# Patient Record
Sex: Female | Born: 1950 | Race: White | Hispanic: No
Health system: Southern US, Community
[De-identification: ages and names within clinical notes are randomized; demographics above are authoritative.]

---

## 2010-10-13 ENCOUNTER — Inpatient Hospital Stay: Payer: Self-pay | Admitting: Internal Medicine

## 2011-04-15 ENCOUNTER — Inpatient Hospital Stay: Payer: Self-pay

## 2011-04-23 ENCOUNTER — Emergency Department: Payer: Self-pay | Admitting: Emergency Medicine

## 2012-02-21 ENCOUNTER — Ambulatory Visit: Payer: Self-pay | Admitting: Family Medicine

## 2013-06-01 ENCOUNTER — Encounter: Payer: Self-pay | Admitting: Family Medicine

## 2013-06-04 ENCOUNTER — Encounter: Payer: Self-pay | Admitting: Family Medicine

## 2013-07-04 ENCOUNTER — Encounter: Payer: Self-pay | Admitting: Family Medicine

## 2013-08-04 ENCOUNTER — Encounter: Payer: Self-pay | Admitting: Family Medicine

## 2013-09-03 ENCOUNTER — Encounter: Payer: Self-pay | Admitting: Family Medicine

## 2013-09-18 ENCOUNTER — Ambulatory Visit: Payer: Self-pay | Admitting: Ophthalmology

## 2014-01-01 ENCOUNTER — Ambulatory Visit: Payer: Self-pay | Admitting: Ophthalmology

## 2014-07-24 ENCOUNTER — Ambulatory Visit: Payer: Self-pay | Admitting: Family Medicine

## 2015-01-24 NOTE — Op Note (Signed)
PATIENT NAME:  Dominique SamplesODD, Kimble MR#:  [id number] DATE OF BIRTH:  Jan 16, 1951  DATE OF PROCEDURE:  09/18/2013  PREOPERATIVE DIAGNOSIS: Visually significant cataract of the right eye.   POSTOPERATIVE DIAGNOSIS: Visually significant cataract of the right eye.   OPERATIVE PROCEDURE: Cataract extraction by phacoemulsification with implant of intraocular lens to right eye.   SURGEON: Galen ManilaWilliam Kinslie Hove, MD.   ANESTHESIA:  1. Managed anesthesia care.  2. Topical tetracaine drops followed by 2% Xylocaine jelly applied in the preoperative holding area.   COMPLICATIONS: None.   TECHNIQUE:  Stop-and-chop.  DESCRIPTION OF PROCEDURE: The patient was examined and consented in the preoperative holding area where the aforementioned topical anesthesia was applied to the right eye and then brought back to the Operating Room where the right  eye was prepped and draped in the usual sterile ophthalmic fashion and a lid speculum was placed. A paracentesis was created with the side port blade and the anterior chamber was filled with viscoelastic. A near clear corneal incision was performed with the steel keratome. A continuous curvilinear capsulorrhexis was performed with a cystotome followed by the capsulorrhexis forceps. Hydrodissection and hydrodelineation were carried out with BSS on a blunt cannula. The lens was removed in a stop-and-chop technique and the remaining cortical material was removed with the irrigation-aspiration handpiece. The capsular bag was inflated with viscoelastic and the Tecnis ZCB00, 23.5-diopter lens, serial number [id number] was placed in the capsular bag without complication. The remaining viscoelastic was removed from the eye with the irrigation-aspiration handpiece. The wounds were hydrated. The anterior chamber was flushed with Miostat and the eye was inflated to physiologic pressure. 0.1 mL of cefuroxime concentration 10 mg/mL was placed in the anterior chamber. The wounds were found to be  water tight. The eye was dressed with Vigamox. The patient was given protective glasses to wear throughout the day and a shield with which to sleep tonight. The patient was also given drops with which to begin a drop regimen today and will follow-up with me in one day.   ____________________________ Jerilee FieldWilliam L. Taelor Waymire, MD wlp:cs D: 09/18/2013 15:26:09 ET T: 09/18/2013 15:53:43 ET JOB#: [id number]  cc: Kyren Knick L. Bruno Leach, MD, <Dictator> Jerilee FieldWILLIAM L Aylee Littrell MD ELECTRONICALLY SIGNED 09/19/2013 9:50

## 2015-01-25 NOTE — Op Note (Signed)
PATIENT NAME:  Dominique SamplesODD, Angelene MR#:  [id number] DATE OF BIRTH:  Mar 31, 1951  DATE OF PROCEDURE:  01/01/2014  PREOPERATIVE DIAGNOSIS: Visually significant cataract of the left eye.   POSTOPERATIVE DIAGNOSIS: Visually significant cataract of the left eye.   OPERATIVE PROCEDURE: Cataract extraction by phacoemulsification with implant of intraocular lens to left eye.   SURGEON: Galen ManilaWilliam Paton Crum, MD.   ANESTHESIA:  1. Managed anesthesia care.  2. Topical tetracaine drops followed by 2% Xylocaine jelly applied in the preoperative holding area.   COMPLICATIONS: None.   TECHNIQUE: Stop and chop.   DESCRIPTION OF PROCEDURE: The patient was examined and consented in the preoperative holding area where the aforementioned topical anesthesia was applied to the left eye and then brought back to the Operating Room where the left eye was prepped and draped in the usual sterile ophthalmic fashion and a lid speculum was placed. A paracentesis was created with the side port blade and the anterior chamber was filled with viscoelastic. A near clear corneal incision was performed with the steel keratome. A continuous curvilinear capsulorrhexis was performed with a cystotome followed by the capsulorrhexis forceps. Hydrodissection and hydrodelineation were carried out with BSS on a blunt cannula. The lens was removed in a stop and chop technique and the remaining cortical material was removed with the irrigation-aspiration handpiece. The capsular bag was inflated with viscoelastic and the Tecnis ZCB00 23.5-diopter lens, serial number [id number] was placed in the capsular bag without complication. The remaining viscoelastic was removed from the eye with the irrigation-aspiration handpiece. The wounds were hydrated. The anterior chamber was flushed with Miostat and the eye was inflated to physiologic pressure. 0.1 mL of cefuroxime concentration 10 mg/mL was placed in the anterior chamber. The wounds were found to be water  tight. The eye was dressed with Vigamox. The patient was given protective glasses to wear throughout the day and a shield with which to sleep tonight. The patient was also given drops with which to begin a drop regimen today and will follow up with me in 1 day.    ____________________________ Jerilee FieldWilliam L. Jatin Naumann, MD wlp:lt D: 01/01/2014 21:25:54 ET T: 01/01/2014 23:28:50 ET JOB#: [id number]  cc: Victoriana Aziz L. Ralene Gasparyan, MD, <Dictator> Jerilee FieldWILLIAM L Rosiland Sen MD ELECTRONICALLY SIGNED 01/02/2014 8:58

## 2018-05-28 ENCOUNTER — Emergency Department
Admission: EM | Admit: 2018-05-28 | Discharge: 2018-05-28 | Disposition: A | Discharge status: Home / Self Care | Payer: Medicare Other | Attending: Student in an Organized Health Care Education/Training Program | Admitting: Student in an Organized Health Care Education/Training Program

## 2018-05-28 ENCOUNTER — Emergency Department: Payer: Medicare Other

## 2018-05-28 ENCOUNTER — Other Ambulatory Visit: Payer: Self-pay

## 2018-05-28 DIAGNOSIS — S0990XA Unspecified injury of head, initial encounter: Secondary | ICD-10-CM

## 2018-05-28 DIAGNOSIS — Y998 Other external cause status: Secondary | ICD-10-CM | POA: Diagnosis not present

## 2018-05-28 DIAGNOSIS — Y929 Unspecified place or not applicable: Secondary | ICD-10-CM | POA: Insufficient documentation

## 2018-05-28 DIAGNOSIS — W19XXXA Unspecified fall, initial encounter: Secondary | ICD-10-CM

## 2018-05-28 DIAGNOSIS — W07XXXA Fall from chair, initial encounter: Secondary | ICD-10-CM | POA: Diagnosis not present

## 2018-05-28 DIAGNOSIS — Y939 Activity, unspecified: Secondary | ICD-10-CM | POA: Insufficient documentation

## 2018-05-28 LAB — BASIC METABOLIC PANEL
Anion gap: 7 (ref 5–15)
BUN: 20 mg/dL (ref 8–23)
CHLORIDE: 99 mmol/L (ref 98–111)
CO2: 29 mmol/L (ref 22–32)
CREATININE: 0.73 mg/dL (ref 0.44–1.00)
Calcium: 9.8 mg/dL (ref 8.9–10.3)
GFR calc Af Amer: >60 mL/min (ref >60)
GFR calc non Af Amer: >60 mL/min (ref >60)
Glucose, Bld: 133 mg/dL — ABNORMAL HIGH (ref 70–99)
Potassium: 4.6 mmol/L (ref 3.5–5.1)
Sodium: 135 mmol/L (ref 135–145)

## 2018-05-28 LAB — CBC WITH DIFFERENTIAL/PLATELET
Basophils Absolute: 0.1 10*3/uL (ref 0–0.1)
Basophils Relative: 1 %
Eosinophils Absolute: 0.1 10*3/uL (ref 0–0.7)
Eosinophils Relative: 2 %
HEMATOCRIT: 39.2 % (ref 35.0–47.0)
HEMOGLOBIN: 13.6 g/dL (ref 12.0–16.0)
LYMPHS ABS: 1.8 10*3/uL (ref 1.0–3.6)
Lymphocytes Relative: 23 %
MCH: 32.3 pg (ref 26.0–34.0)
MCHC: 34.7 g/dL (ref 32.0–36.0)
MCV: 93.2 fL (ref 80.0–100.0)
MONOS PCT: 8 %
Monocytes Absolute: 0.7 10*3/uL (ref 0.2–0.9)
NEUTROS PCT: 66 %
Neutro Abs: 5.3 10*3/uL (ref 1.4–6.5)
Platelets: 304 10*3/uL (ref 150–440)
RBC: 4.21 MIL/uL (ref 3.80–5.20)
RDW: 13.8 % (ref 11.5–14.5)
WBC: 7.9 10*3/uL (ref 3.6–11.0)

## 2018-05-28 NOTE — ED Provider Notes (Signed)
[facility] Emergency Department Provider Note    First MD Initiated Contact with Patient 05/28/18 1818     (approximate)  I have reviewed the triage vital signs and the nursing notes.   HISTORY  Chief Complaint Fall    HPI Dominique Obrien is a 67 y.o. female since the ER from [facility] status post recent IPH with residual right-sided deficits after she had a mechanical fall rolling out of her chair area did hit her head.  This is an unwitnessed fall.  She is complaining of left wrist pain mild headache.  Not currently on any blood thinners.  Son at bedside states that she otherwise looks well.  States that he has seen her leaning to the right out of her chair and that they may need to put a belt on her if she is not can be observed or monitored due to concern for fall.    No past medical history on file. No family history on file.  There are no active problems to display for this patient.     Prior to Admission medications   Not on File    Allergies Lisinopril and Morphine and related    Social History Social History   Tobacco Use  . Smoking status: Not on file  Substance Use Topics  . Alcohol use: Not on file  . Drug use: Not on file    Review of Systems Patient denies headaches, rhinorrhea, blurry vision, numbness, shortness of breath, chest pain, edema, cough, abdominal pain, nausea, vomiting, diarrhea, dysuria, fevers, rashes or hallucinations unless otherwise stated above in HPI. ____________________________________________   PHYSICAL EXAM:  VITAL SIGNS: Vitals:   05/28/18 2100 05/28/18 2130  BP: (!) 129/52 (!) 119/48  Pulse: 75   Resp: 13 13  SpO2: 95%     Constitutional: Alert , chronically ill appearing Eyes: Conjunctivae are normal.  Head: small contusion to right forehead. Nose: No congestion/rhinnorhea. Mouth/Throat: Mucous membranes are moist.   Neck: No stridor. No step offs or  deformity Cardiovascular: Normal rate, regular rhythm. Grossly normal heart sounds.  Good peripheral circulation. Respiratory: Normal respiratory effort.  No retractions. Lungs CTAB. Gastrointestinal: Soft and nontender. No distention. No abdominal bruits. No CVA tenderness. Genitourinary:  Musculoskeletal: No lower extremity tenderness nor edema.  No joint effusions. Neurologic:  Aphasic with right sided weakness, left side with good strength and tone. Skin:  Skin is warm, dry and intact. No rash noted. Psychiatric: Mood and affect are normal. Speech and behavior are normal.  ____________________________________________   LABS (all labs ordered are listed, but only abnormal results are displayed)  No results found for this or any previous visit (from the past 24 hour(s)). ____________________________________________  EKG My review and personal interpretation at Time: 05/28/18   Indication: fall  Rate: 70  Rhythm: sinus Axis: normal Other: normal intervals, no stemi ____________________________________________  RADIOLOGY  I personally reviewed all radiographic images ordered to evaluate for the above acute complaints and reviewed radiology reports and findings.  These findings were personally discussed with the patient.  Please see medical record for radiology report.  ____________________________________________   PROCEDURES  Procedure(s) performed:  Procedures    Critical Care performed: no ____________________________________________   INITIAL IMPRESSION / ASSESSMENT AND PLAN / ED COURSE  Pertinent labs & imaging results that were available during my care of the patient were reviewed by me and considered in my medical decision making (see chart for details).   DDX: sdh, iph, fracture, contusion  Dominique Obrien is a 67 y.o. who presents to the ED with symptoms as described above.  Patient afebrile Heema dynamically stable.  CT imaging will be ordered to evaluate for  acute intracranial abnormality as well as cervical spine fracture as I am not able to clear her C-spine.  Family at bedside states patient is otherwise acting at her baseline.  Clinical Course as of May 28 2208  Wynelle LinkSun May 28, 2018  2029 Spoke with Dr. Linde GillisMaynard of radiology.  CT head report based off of most recent CT imaging in our system being 2012.  [facility] to see if we can get more recent imaging.   [PR]  2123 Radiology report from CT head 05/11/18:  Intraparenchymal hemorrhage with intraventricular extension in the region of the left thalamus remains unchanged in size when compared to prior study. There is increased amount of edema surrounding the hemorrhage which is appears to have mass effect causing a rightward shift of approximately 0.4 cm. The basal cisterns remain patent. There are hyperdensities in the dependent portions of the lateral ventricles which may represent interval redistribution of blood products. Lateral ventricles are enlarged to a similar or slightly greater extent than immediate previous exam. Encephalomalacia in the right parietal lobe stable, consistent with remote infarct.   No fractures are evident.The sinuses are pneumatized.   [PR]  2139 Gust case with Dr. Linde GillisMaynard of radiology.  [facility]  Based on these findings CT imaging today does look like appropriate resolving hemorrhage.  No evidence of traumatic injury.  This point do believe patient stable and appropriate for outpatient follow-up.   [PR]    Clinical Course User Index [PR] Willy Eddyobinson, Tomoki Lucken, MD     As part of my medical decision making, I reviewed the following data within the electronic MEDICAL RECORD NUMBER Nursing notes reviewed and incorporated, Labs reviewed, notes from prior ED visits.   ____________________________________________   FINAL CLINICAL IMPRESSION(S) / ED DIAGNOSES  Final diagnoses:  Fall, initial encounter  Injury of head, initial encounter      NEW  MEDICATIONS STARTED DURING THIS VISIT:  New Prescriptions   No medications on file     Note:  This document was prepared using Dragon voice recognition software and may include unintentional dictation errors.    Willy Eddyobinson, Dannisha Eckmann, MD 05/28/18 2209

## 2018-05-28 NOTE — ED Notes (Signed)
Family at bedside. Son states that pt had stroke 3 weeks ago and since then has had some trouble speaking, slurred speech and right sided weakness. Son states before stroke she was able to care pretty well for herself.

## 2018-05-28 NOTE — ED Notes (Signed)
Patient transported to CT 

## 2018-05-28 NOTE — ED Triage Notes (Signed)
Nursing staff from [facility] reported that the patient was just seen seated in her chair and a couple minutes later patient was found in the floor, pt has a hematoma to the right forehead and rt elbow, pt does take plavix and asa. Pt denies pain at this time, pt has a hx of intracerebral hemorrhage.

## 2019-08-13 IMAGING — CT CT CERVICAL SPINE W/O CM
3 of 8 series · 9 of 33 positions shown, 10 images · non-contrast
Comparison: Head CT dated 04/23/2011.  Brain MRI dated 04/16/2011.

CLINICAL DATA: Found down, hematoma to RIGHT forehead and RIGHT
elbow. Stroke 3 weeks ago.

EXAM:
CT HEAD WITHOUT CONTRAST
CT CERVICAL SPINE WITHOUT CONTRAST
TECHNIQUE: Multidetector CT imaging of the head and cervical spine was
performed following the standard protocol without intravenous
contrast. Multiplanar CT image reconstructions of the cervical spine
were also generated.

[Series 10: sagittal bone · sagittal · 0.18mm/px · 5 of 59 slices shown]
[im 10/59  bone]
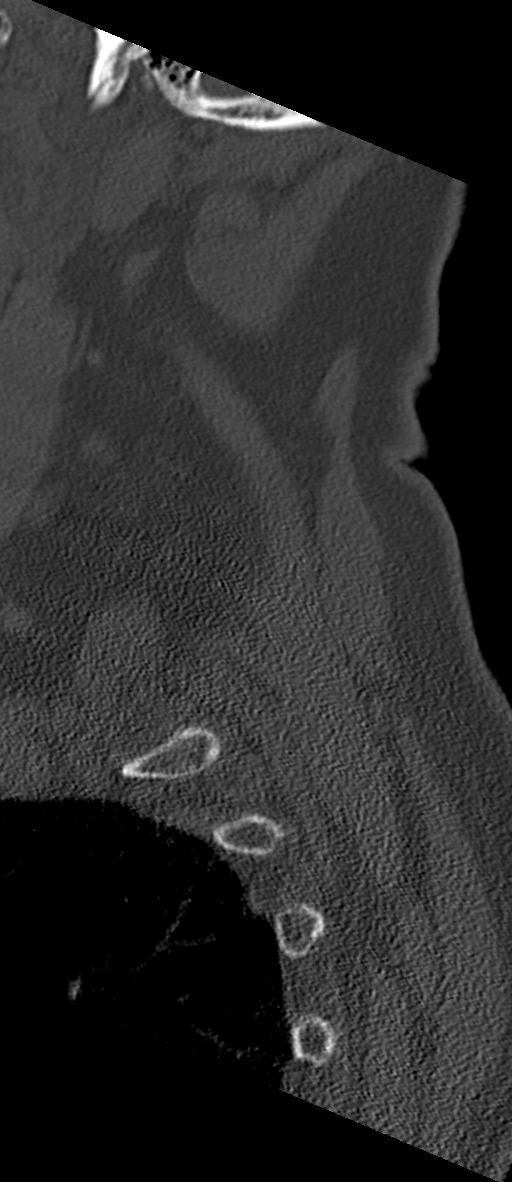
[im 20/59  bone]
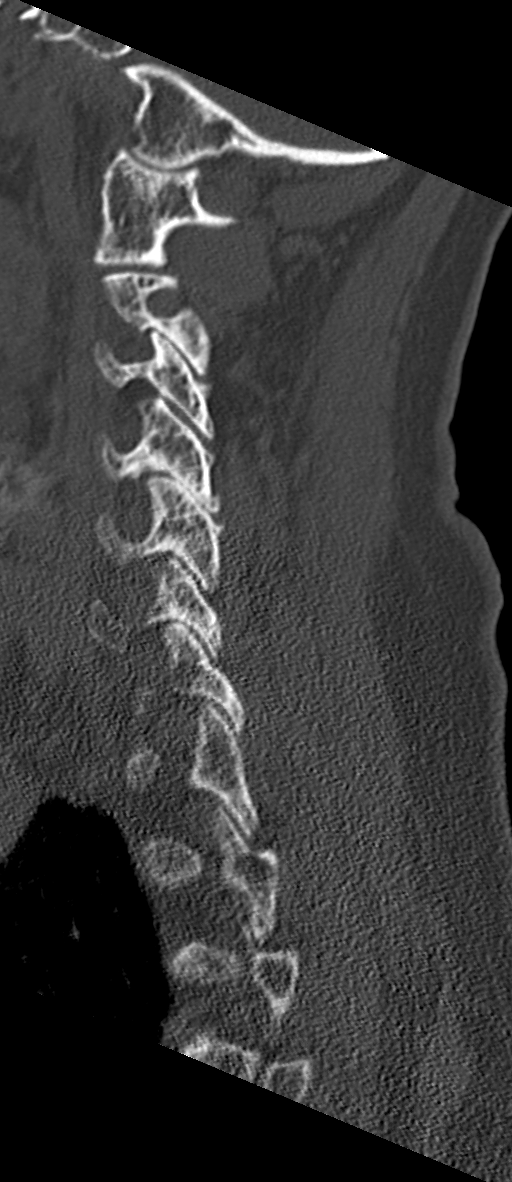
[im 30/59  bone]
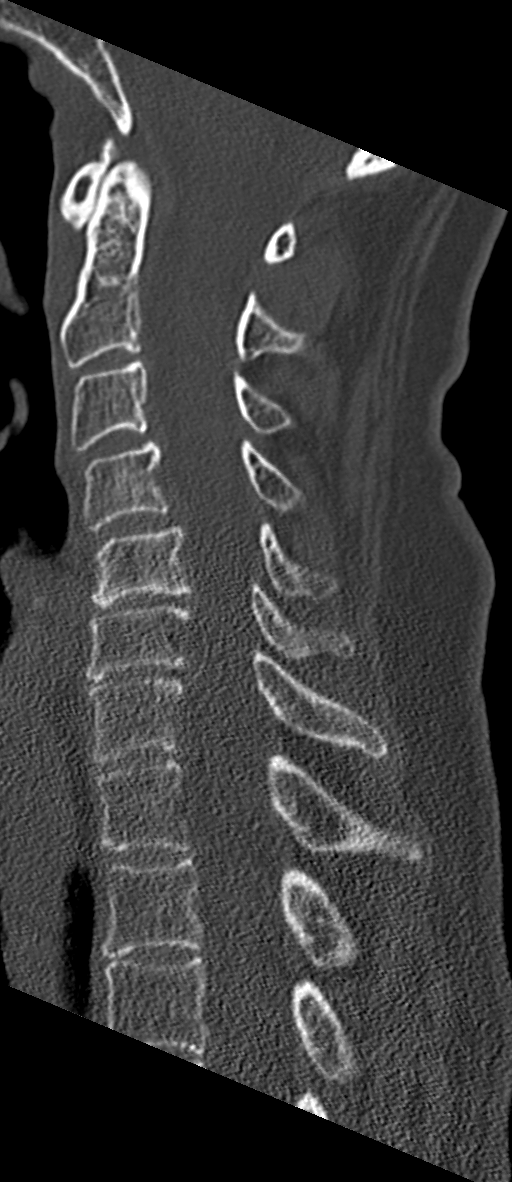
[im 39/59  bone]
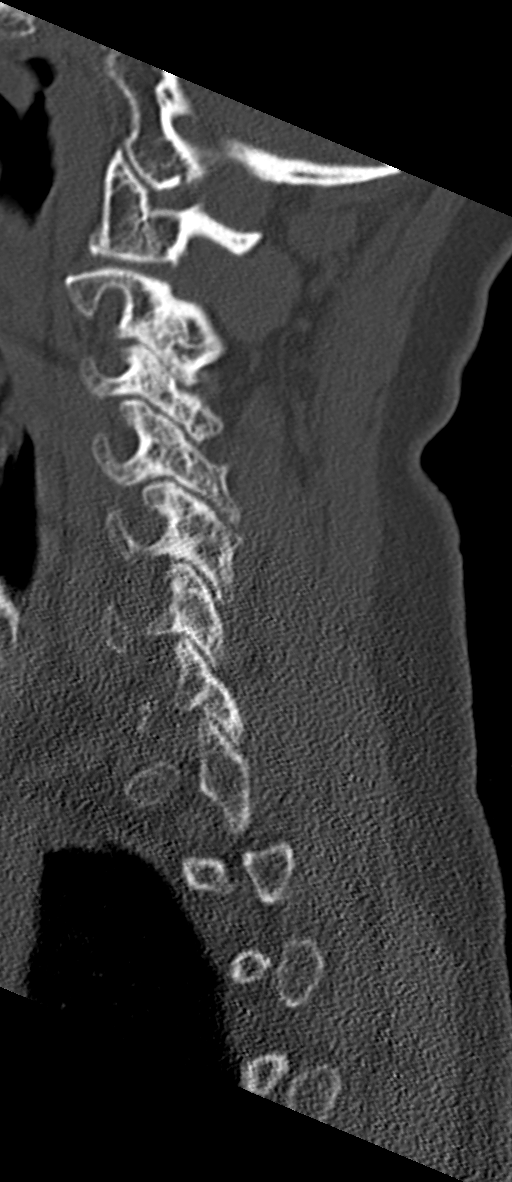
[im 49/59  bone]
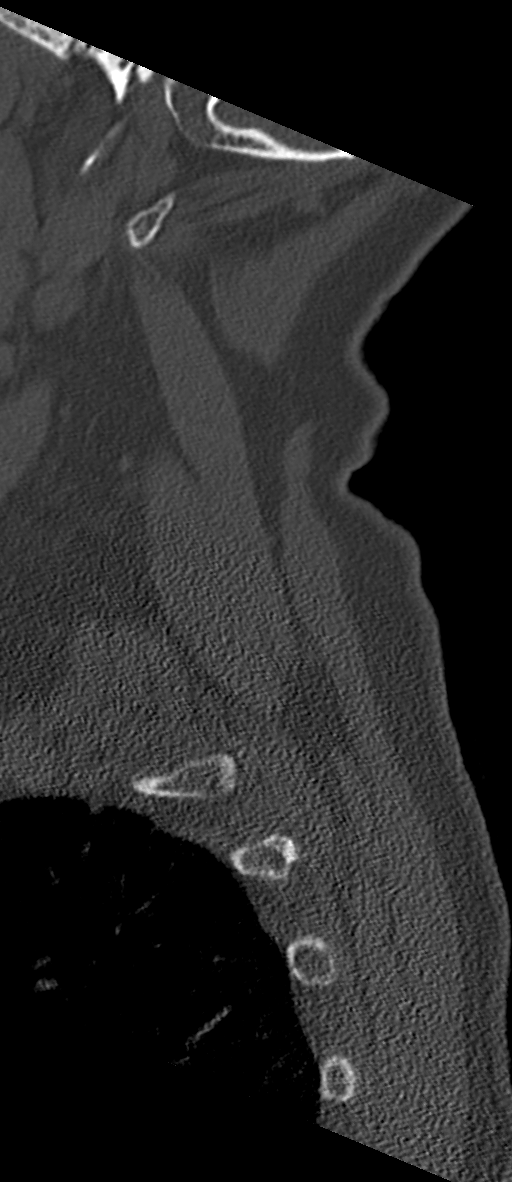

[Series 11: coronal bone · coronal · 0.23mm/px · 1 of 46 slices shown]
[im 23/46  bone]
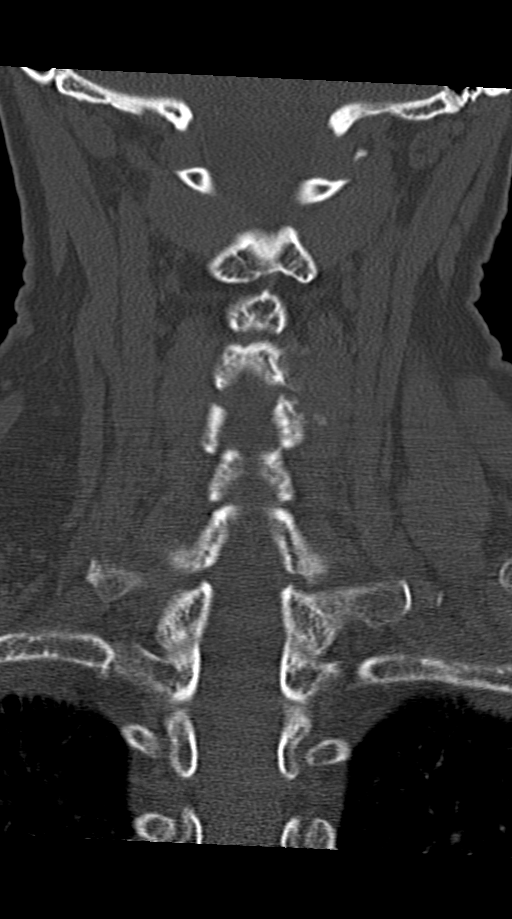

[Series 12: orthogonal bone · axial · 0.18mm/px · z∈[+139,+234]mm · 3 of 106 slices shown, 4 images]
[im 27/106  soft-tissue]
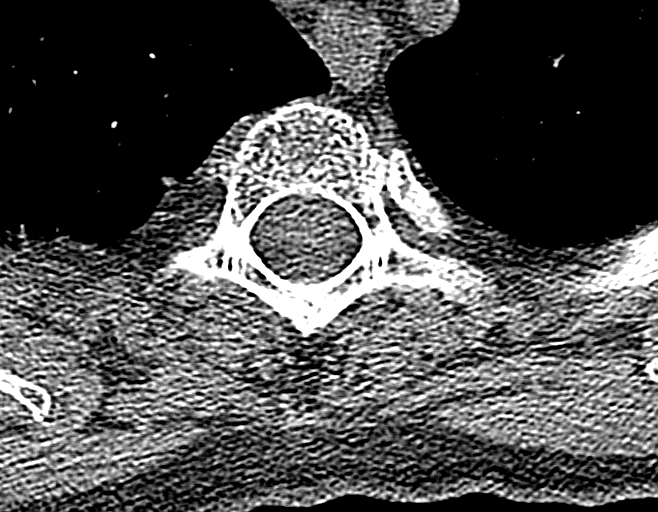
[im 27/106  bone]
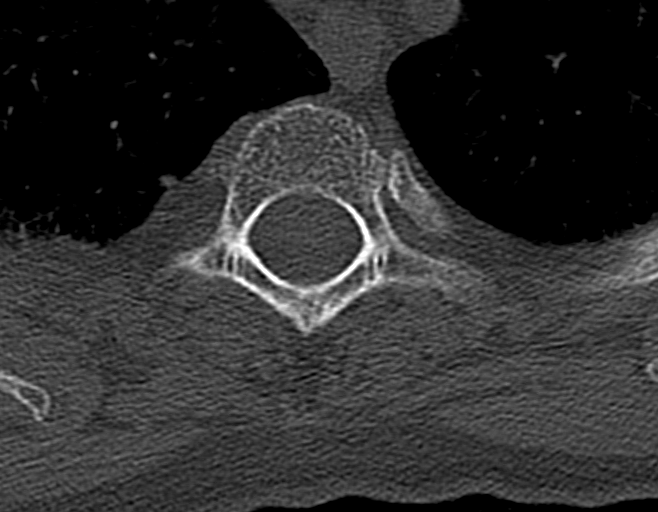
[im 53/106  bone]
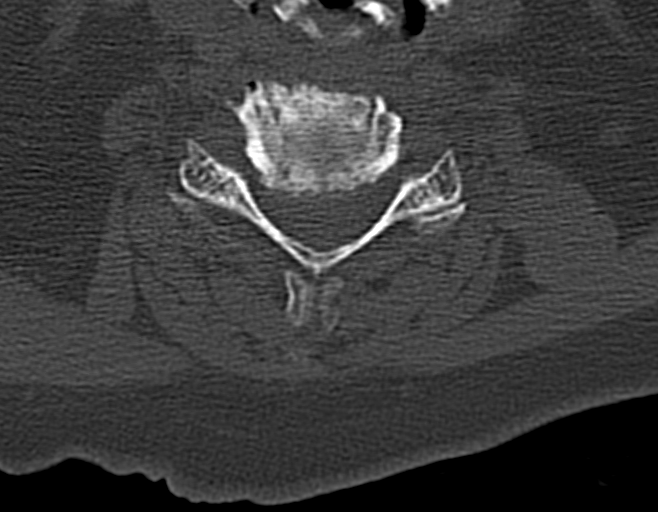
[im 79/106  bone]
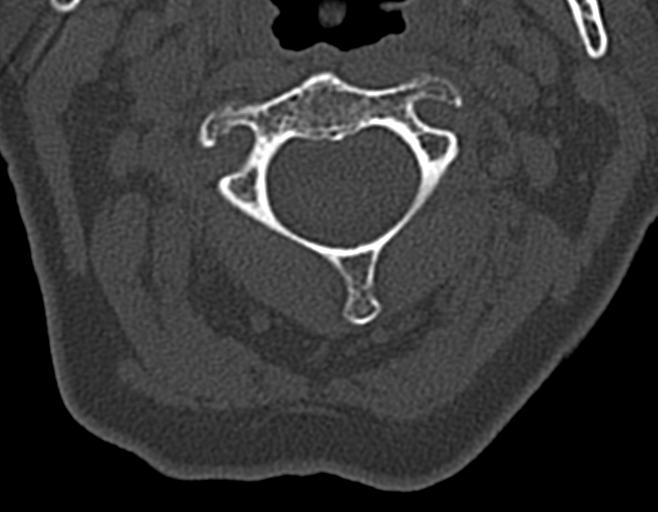

[9 of 33 positions shown; findings below may reference images not displayed]

FINDINGS: CT HEAD FINDINGS

Brain: Ill-defined edema within the LEFT basal ganglia, centered at
the LEFT thalamus, measuring approximately 2.2 cm greatest
dimension, with local mass effect resulting in mild rightward
midline shift at the level of the third ventricle.

Old RIGHT parietooccipital lobe and RIGHT temporal lobe infarctions
with associated encephalomalacia. Additional chronic small vessel
ischemic changes within the periventricular white matter regions
bilaterally and RIGHT basal ganglia.

No parenchymal or extra-axial hemorrhage.

Vascular: Chronic calcified atherosclerotic changes of the large
vessels at the skull base. No unexpected hyperdense vessel.

Skull: Normal. Negative for fracture or focal lesion.

Sinuses/Orbits: No acute finding.

Other: Focal scalp edema/hematoma overlying the lower RIGHT frontal
bone. No underlying fracture seen.

CT CERVICAL SPINE FINDINGS

Alignment: Slight reversal of the normal cervical spine lordosis
related to degenerative spondylitic changes within the mid and lower
cervical spine. No evidence of acute vertebral body subluxation.

Skull base and vertebrae: No fracture line or displaced fracture
fragment seen. Facet joints appear intact and normally aligned
throughout.

Soft tissues and spinal canal: No prevertebral fluid or swelling. No
visible canal hematoma.

Disc levels: Degenerative spondylitic changes throughout the mid and
lower cervical spine, as manifested by slight disc space narrowings
and mild osseous spurring. Associated disc-osteophytic bulge at C5-6
is causing moderate central canal stenosis and bilateral neural
foramen stenoses. Additional degenerative hypertrophy of the
uncovertebral and facet joints are causing mild to moderate neural
foramen stenoses at C3-4, C4-5 and C6-7.

Upper chest: No acute findings. Emphysematous blebs at the bilateral
lung apices.

Other: Bilateral carotid atherosclerosis.
IMPRESSION: 1. Ill-defined edema within the LEFT basal ganglia region, centered
within the LEFT thalamus and posterior limb of the LEFT internal
capsule, measuring approximately 2 cm greatest dimension, with local
mass effect and associated mild rightward midline shift at the level
of the third ventricle. Findings are consistent with acute to
subacute infarction.
2. Additional old infarcts within the RIGHT parietal lobe and RIGHT
temporal lobe, with associated encephalomalacia.
3. No intracranial hemorrhage.
4. No fracture or acute subluxation within the cervical spine.
5. Degenerative changes of the cervical spine, as detailed above.
6. Biapical emphysematous change.
7. Carotid atherosclerosis.

## 2019-11-05 DEATH — deceased
